# Patient Record
Sex: Male | Born: 1990
Health system: Southern US, Community
[De-identification: ages and names within clinical notes are randomized; demographics above are authoritative.]

---
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
## 2014-12-07 ENCOUNTER — Emergency Department (HOSPITAL_BASED_OUTPATIENT_CLINIC_OR_DEPARTMENT_OTHER)
Admission: EM | Admit: 2014-12-07 | Discharge: 2014-12-07 | Disposition: A | Discharge status: Home / Self Care | Payer: No Typology Code available for payment source | Attending: Emergency Medicine | Admitting: Emergency Medicine

## 2014-12-07 ENCOUNTER — Encounter (HOSPITAL_BASED_OUTPATIENT_CLINIC_OR_DEPARTMENT_OTHER): Payer: Self-pay | Admitting: *Deleted

## 2014-12-07 DIAGNOSIS — Y92410 Unspecified street and highway as the place of occurrence of the external cause: Secondary | ICD-10-CM | POA: Diagnosis not present

## 2014-12-07 DIAGNOSIS — S8992XA Unspecified injury of left lower leg, initial encounter: Secondary | ICD-10-CM | POA: Diagnosis not present

## 2014-12-07 DIAGNOSIS — M25562 Pain in left knee: Secondary | ICD-10-CM

## 2014-12-07 DIAGNOSIS — M25561 Pain in right knee: Secondary | ICD-10-CM

## 2014-12-07 DIAGNOSIS — Y9389 Activity, other specified: Secondary | ICD-10-CM | POA: Insufficient documentation

## 2014-12-07 DIAGNOSIS — Y998 Other external cause status: Secondary | ICD-10-CM | POA: Insufficient documentation

## 2014-12-07 DIAGNOSIS — S8991XA Unspecified injury of right lower leg, initial encounter: Secondary | ICD-10-CM | POA: Diagnosis not present

## 2014-12-07 MED ORDER — IBUPROFEN 400 MG PO TABS: 400.0000 mg | ORAL_TABLET | Freq: Once | ORAL | Status: DC

## 2014-12-07 MED ORDER — IBUPROFEN 400 MG PO TABS: 400.0000 mg | ORAL_TABLET | Freq: Four times a day (QID) | ORAL | Status: AC | PRN

## 2014-12-07 NOTE — Discharge Instructions (Signed)
It is important for you to apply ice to knees as well as elevated above the level of your heart. He may also take your anti-inflammatory medication as needed for your discomfort. Follow-up with primary care for further valuation management of your symptoms. Return to ED for new or worsening symptoms.  Arthralgia Your caregiver has diagnosed you as suffering from an arthralgia. Arthralgia means there is pain in a joint. This can come from many reasons including:  Bruising the joint which causes soreness (inflammation) in the joint.  Wear and tear on the joints which occur as we grow older (osteoarthritis).  Overusing the joint.  Various forms of arthritis.  Infections of the joint. Regardless of the cause of pain in your joint, most of these different pains respond to anti-inflammatory drugs and rest. The exception to this is when a joint is infected, and these cases are treated with antibiotics, if it is a bacterial infection. HOME CARE INSTRUCTIONS   Rest the injured area for as long as directed by your caregiver. Then slowly start using the joint as directed by your caregiver and as the pain allows. Crutches as directed may be useful if the ankles, knees or hips are involved. If the knee was splinted or casted, continue use and care as directed. If an stretchy or elastic wrapping bandage has been applied today, it should be removed and re-applied every 3 to 4 hours. It should not be applied tightly, but firmly enough to keep swelling down. Watch toes and feet for swelling, bluish discoloration, coldness, numbness or excessive pain. If any of these problems (symptoms) occur, remove the ace bandage and re-apply more loosely. If these symptoms persist, contact your caregiver or return to this location.  For the first 24 hours, keep the injured extremity elevated on pillows while lying down.  Apply ice for 15-20 minutes to the sore joint every couple hours while awake for the first half day. Then  03-04 times per day for the first 48 hours. Put the ice in a plastic bag and place a towel between the bag of ice and your skin.  Wear any splinting, casting, elastic bandage applications, or slings as instructed.  Only take over-the-counter or prescription medicines for pain, discomfort, or fever as directed by your caregiver. Do not use aspirin immediately after the injury unless instructed by your physician. Aspirin can cause increased bleeding and bruising of the tissues.  If you were given crutches, continue to use them as instructed and do not resume weight bearing on the sore joint until instructed. Persistent pain and inability to use the sore joint as directed for more than 2 to 3 days are warning signs indicating that you should see a caregiver for a follow-up visit as soon as possible. Initially, a hairline fracture (break in bone) may not be evident on X-rays. Persistent pain and swelling indicate that further evaluation, non-weight bearing or use of the joint (use of crutches or slings as instructed), or further X-rays are indicated. X-rays may sometimes not show a small fracture until a week or 10 days later. Make a follow-up appointment with your own caregiver or one to whom we have referred you. A radiologist (specialist in reading X-rays) may read your X-rays. Make sure you know how you are to obtain your X-ray results. Do not assume everything is normal if you do not hear from us. SEEK MEDICAL CARE IF: Bruising, swelling, or pain increases. SEEK IMMEDIATE MEDICAL CARE IF:   Your fingers or toes are  numb or blue.  The pain is not responding to medications and continues to stay the same or get worse.  The pain in your joint becomes severe.  You develop a fever over 102 F (38.9 C).  It becomes impossible to move or use the joint. MAKE SURE YOU:   Understand these instructions.  Will watch your condition.  Will get help right away if you are not doing well or get  worse. Document Released: 05/14/2005 Document Revised: 08/06/2011 Document Reviewed: 12/31/2007 [facility] Patient Information 2015 [place], [place]. This information is not intended to replace advice given to you by your health care provider. Make sure you discuss any questions you have with your health care provider.
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
## 2017-06-13 ENCOUNTER — Emergency Department (HOSPITAL_BASED_OUTPATIENT_CLINIC_OR_DEPARTMENT_OTHER): Payer: No Typology Code available for payment source

## 2017-06-13 ENCOUNTER — Encounter (HOSPITAL_BASED_OUTPATIENT_CLINIC_OR_DEPARTMENT_OTHER): Payer: Self-pay | Admitting: Emergency Medicine

## 2017-06-13 ENCOUNTER — Emergency Department (HOSPITAL_COMMUNITY)
Admission: EM | Admit: 2017-06-13 | Discharge: 2017-06-13 | Discharge status: Absent without leave | Payer: No Typology Code available for payment source

## 2017-06-13 ENCOUNTER — Other Ambulatory Visit: Payer: Self-pay

## 2017-06-13 ENCOUNTER — Emergency Department (HOSPITAL_BASED_OUTPATIENT_CLINIC_OR_DEPARTMENT_OTHER)
Admission: EM | Admit: 2017-06-13 | Discharge: 2017-06-13 | Disposition: A | Discharge status: Home / Self Care | Payer: No Typology Code available for payment source | Attending: Emergency Medicine | Admitting: Emergency Medicine

## 2017-06-13 DIAGNOSIS — Z87891 Personal history of nicotine dependence: Secondary | ICD-10-CM | POA: Insufficient documentation

## 2017-06-13 DIAGNOSIS — Y92410 Unspecified street and highway as the place of occurrence of the external cause: Secondary | ICD-10-CM | POA: Diagnosis not present

## 2017-06-13 DIAGNOSIS — Y999 Unspecified external cause status: Secondary | ICD-10-CM | POA: Diagnosis not present

## 2017-06-13 DIAGNOSIS — Y9389 Activity, other specified: Secondary | ICD-10-CM | POA: Diagnosis not present

## 2017-06-13 DIAGNOSIS — S3982XA Other specified injuries of lower back, initial encounter: Secondary | ICD-10-CM | POA: Diagnosis present

## 2017-06-13 DIAGNOSIS — S39012A Strain of muscle, fascia and tendon of lower back, initial encounter: Secondary | ICD-10-CM

## 2017-06-13 MED ORDER — CYCLOBENZAPRINE HCL 10 MG PO TABS: 10.0000 mg | ORAL_TABLET | Freq: Two times a day (BID) | ORAL | 0 refills | Status: AC | PRN

## 2017-06-13 MED ORDER — NAPROXEN 500 MG PO TABS: 500.0000 mg | ORAL_TABLET | Freq: Two times a day (BID) | ORAL | 0 refills | Status: AC | PRN

## 2017-06-13 MED FILL — CYCLOBENZAPRINE HCL 10 MG T: 10 | 4 days supply | Qty: 8 | Fill #0

## 2017-06-13 MED FILL — NAPROXEN 500 MG TABLET: 500 | 10 days supply | Qty: 20 | Fill #0
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
## 2019-02-11 ENCOUNTER — Ambulatory Visit: Payer: Self-pay | Admitting: Registered Nurse

## 2019-02-12 ENCOUNTER — Encounter: Payer: Self-pay | Admitting: Registered Nurse
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
